# Patient Record
Sex: Female | Born: 1990 | Hispanic: Yes | Marital: Married | Smoking: Never smoker
Health system: Southern US, Community
[De-identification: ages and names within clinical notes are randomized; demographics above are authoritative.]

---

## 2018-10-29 ENCOUNTER — Emergency Department (HOSPITAL_COMMUNITY)
Admission: EM | Admit: 2018-10-29 | Discharge: 2018-10-29 | Disposition: A | Discharge status: Home / Self Care | Payer: BLUE CROSS/BLUE SHIELD | Attending: Emergency Medicine | Admitting: Emergency Medicine

## 2018-10-29 ENCOUNTER — Emergency Department (HOSPITAL_COMMUNITY): Payer: BLUE CROSS/BLUE SHIELD

## 2018-10-29 ENCOUNTER — Encounter (HOSPITAL_COMMUNITY): Payer: Self-pay | Admitting: Emergency Medicine

## 2018-10-29 DIAGNOSIS — N12 Tubulo-interstitial nephritis, not specified as acute or chronic: Secondary | ICD-10-CM | POA: Insufficient documentation

## 2018-10-29 DIAGNOSIS — R109 Unspecified abdominal pain: Secondary | ICD-10-CM | POA: Diagnosis present

## 2018-10-29 LAB — CBC
HCT: 42.3 % (ref 36.0–46.0)
Hemoglobin: 14.6 g/dL (ref 12.0–15.0)
MCH: 30.6 pg (ref 26.0–34.0)
MCHC: 34.5 g/dL (ref 30.0–36.0)
MCV: 88.7 fL (ref 80.0–100.0)
Platelets: 288 10*3/uL (ref 150–400)
RBC: 4.77 MIL/uL (ref 3.87–5.11)
RDW: 12 % (ref 11.5–15.5)
WBC: 6.4 10*3/uL (ref 4.0–10.5)
nRBC: 0 % (ref 0.0–0.2)

## 2018-10-29 LAB — I-STAT BETA HCG BLOOD, ED (MC, WL, AP ONLY): I-stat hCG, quantitative: <5 m[IU]/mL (ref <5)

## 2018-10-29 LAB — URINALYSIS, ROUTINE W REFLEX MICROSCOPIC
Bilirubin Urine: NEGATIVE
Glucose, UA: NEGATIVE mg/dL
Ketones, ur: NEGATIVE mg/dL
Nitrite: NEGATIVE
Protein, ur: 30 mg/dL — AB
Specific Gravity, Urine: 1.013 (ref 1.005–1.030)
Trans Epithel, UA: 1
WBC, UA: >50 WBC/hpf — ABNORMAL HIGH (ref 0–5)
pH: 6 (ref 5.0–8.0)

## 2018-10-29 LAB — BASIC METABOLIC PANEL
Anion gap: 12 (ref 5–15)
BUN: 12 mg/dL (ref 6–20)
CO2: 21 mmol/L — ABNORMAL LOW (ref 22–32)
Calcium: 9.4 mg/dL (ref 8.9–10.3)
Chloride: 103 mmol/L (ref 98–111)
Creatinine, Ser: 0.73 mg/dL (ref 0.44–1.00)
GFR calc Af Amer: >60 mL/min (ref >60)
GFR calc non Af Amer: >60 mL/min (ref >60)
Glucose, Bld: 128 mg/dL — ABNORMAL HIGH (ref 70–99)
Potassium: 3.9 mmol/L (ref 3.5–5.1)
Sodium: 136 mmol/L (ref 135–145)

## 2018-10-29 MED ORDER — SODIUM CHLORIDE 0.9 % IV SOLN
1.0000 g | Freq: Once | INTRAVENOUS | Status: AC
  Administered 2018-10-29: 1 g via INTRAVENOUS
  Filled 2018-10-29: qty 10

## 2018-10-29 MED ORDER — HYDROMORPHONE HCL 1 MG/ML IJ SOLN
1.0000 mg | Freq: Once | INTRAMUSCULAR | Status: AC
  Administered 2018-10-29: 1 mg via INTRAVENOUS
  Filled 2018-10-29: qty 1

## 2018-10-29 MED ORDER — ONDANSETRON 4 MG PO TBDP: ORAL_TABLET | ORAL | 0 refills | Status: AC

## 2018-10-29 MED ORDER — CEPHALEXIN 500 MG PO CAPS: 500.0000 mg | ORAL_CAPSULE | Freq: Four times a day (QID) | ORAL | 0 refills | Status: AC

## 2018-10-29 MED ORDER — ONDANSETRON HCL 4 MG/2ML IJ SOLN
4.0000 mg | Freq: Once | INTRAMUSCULAR | Status: AC
  Administered 2018-10-29: 4 mg via INTRAVENOUS
  Filled 2018-10-29: qty 2

## 2018-10-29 MED ORDER — SODIUM CHLORIDE 0.9 % IV BOLUS
1000.0000 mL | Freq: Once | INTRAVENOUS | Status: AC
  Administered 2018-10-29: 21:00:00 1000 mL via INTRAVENOUS

## 2018-10-29 MED ORDER — HYDROCODONE-ACETAMINOPHEN 5-325 MG PO TABS: 1.0000 | ORAL_TABLET | Freq: Four times a day (QID) | ORAL | 0 refills | Status: AC | PRN

## 2018-10-29 NOTE — ED Provider Notes (Signed)
[facility]   CSN: [id number] Arrival date & time: 10/29/18  1858    History   Chief Complaint Chief Complaint  Patient presents with  . Flank Pain    HPI Audrey Barton is a 28 y.o. female.     Audrey Barton is a 28 y.o. female who is otherwise healthy presents for evaluation of severe right flank pain which began today.  She reports that she has been having burning and pain with urination over the past few days but this morning noted having pain over her right flank this evening pain became much more severe, she now reports that she feels like she cannot sit still due to severe pain.  She has not noted any blood in her urine, denies prior history of kidney stones or kidney infections.  Denies any associated vaginal bleeding or discharge.  She has had some chills but no fevers.  No associated diarrhea, melena or hematochezia.  She has not taken anything to treat her symptoms prior to arrival, no other aggravating or alleviating factors.     History reviewed. No pertinent past medical history.  There are no active problems to display for this patient.   History reviewed. No pertinent surgical history.   OB History   No obstetric history on file.      Home Medications    Prior to Admission medications   Medication Sig Start Date End Date Taking? Authorizing Provider  cephALEXin (KEFLEX) 500 MG capsule Take 1 capsule (500 mg total) by mouth 4 (four) times daily for 10 days. 10/29/18 11/08/18  Dartha LodgeFord, Travante Knee N, PA-C  HYDROcodone-acetaminophen (NORCO) 5-325 MG tablet Take 1 tablet by mouth every 6 (six) hours as needed. 10/29/18   Dartha LodgeFord, Tiara Bartoli N, PA-C  ondansetron (ZOFRAN ODT) 4 MG disintegrating tablet 4mg  ODT q4 hours prn nausea/vomit 10/29/18   Dartha LodgeFord, Ilissa Rosner N, PA-C    Family History No family history on file.  Social History Social History   Tobacco Use  . Smoking status: Never Smoker   . Smokeless tobacco: Never Used  Substance Use Topics  . Alcohol use: Not Currently  . Drug use: Not Currently     Allergies   Patient has no known allergies.   Review of Systems Review of Systems  Constitutional: Negative for chills and fever.  HENT: Negative.   Eyes: Negative for visual disturbance.  Respiratory: Negative for cough and shortness of breath.   Cardiovascular: Negative for chest pain.  Gastrointestinal: Positive for abdominal pain and nausea. Negative for diarrhea and vomiting.  Genitourinary: Positive for dysuria, flank pain and frequency. Negative for hematuria, vaginal bleeding and vaginal discharge.  Musculoskeletal: Negative for arthralgias and myalgias.  Skin: Negative for color change and rash.  Neurological: Negative for syncope and light-headedness.     Physical Exam Updated Vital Signs BP (!) 145/104   Pulse (!) 107   Temp 98.6 F (37 C) (Oral)   Resp (!) 22   SpO2 100%   Physical Exam Vitals signs and nursing note reviewed.  Constitutional:      General: She is in acute distress.     Appearance: Normal appearance. She is well-developed. She is obese. She is not ill-appearing or diaphoretic.     Comments: Patient appears very uncomfortable, having difficulty sitting still due to pain.  HENT:     Head: Normocephalic and atraumatic.     Mouth/Throat:     Mouth: Mucous membranes are moist.  Pharynx: Oropharynx is clear.  Eyes:     General:        Right eye: No discharge.        Left eye: No discharge.     Pupils: Pupils are equal, round, and reactive to light.  Neck:     Musculoskeletal: Neck supple.  Cardiovascular:     Rate and Rhythm: Normal rate and regular rhythm.     Heart sounds: Normal heart sounds. No murmur. No friction rub. No gallop.   Pulmonary:     Effort: Pulmonary effort is normal. No respiratory distress.     Breath sounds: Normal breath sounds. No wheezing or rales.     Comments: Respirations equal and unlabored,  patient able to speak in full sentences, lungs clear to auscultation bilaterally Abdominal:     General: Bowel sounds are normal. There is no distension.     Palpations: Abdomen is soft. There is no mass.     Tenderness: There is no abdominal tenderness. There is right CVA tenderness. There is no guarding.     Comments: Abdomen is soft and nondistended, bowel sounds present throughout, all quadrants nontender to palpation with no guarding or rebound tenderness.  Patient with right-sided CVA tenderness, none on the left.  Musculoskeletal:        General: No deformity.  Skin:    General: Skin is warm and dry.     Capillary Refill: Capillary refill takes less than 2 seconds.  Neurological:     Mental Status: She is alert.     Coordination: Coordination normal.     Comments: Speech is clear, able to follow commands Moves extremities without ataxia, coordination intact  Psychiatric:        Mood and Affect: Mood normal.        Behavior: Behavior normal.      ED Treatments / Results  Labs (all labs ordered are listed, but only abnormal results are displayed) Labs Reviewed  URINALYSIS, ROUTINE W REFLEX MICROSCOPIC - Abnormal; Notable for the following components:      Result Value   APPearance CLOUDY (*)    Hgb urine dipstick MODERATE (*)    Protein, ur 30 (*)    Leukocytes,Ua LARGE (*)    WBC, UA >50 (*)    Bacteria, UA RARE (*)    All other components within normal limits  BASIC METABOLIC PANEL - Abnormal; Notable for the following components:   CO2 21 (*)    Glucose, Bld 128 (*)    All other components within normal limits  URINE CULTURE  CBC  I-STAT BETA HCG BLOOD, ED ([facility], [facility], [facility] ONLY)    EKG None  Radiology Ct Renal Stone Study  Result Date: 10/29/2018 CLINICAL DATA:  Left-sided flank pain EXAM: CT ABDOMEN AND PELVIS WITHOUT CONTRAST TECHNIQUE: Multidetector CT imaging of the abdomen and pelvis was performed following the standard protocol without IV contrast.  COMPARISON:  None. FINDINGS: Lower chest: No acute abnormality. Hepatobiliary: There is hepatomegaly with severe hepatic steatosis. The gallbladder is unremarkable. Pancreas: Unremarkable. No pancreatic ductal dilatation or surrounding inflammatory changes. Spleen: Normal in size without focal abnormality. Adrenals/Urinary Tract: There is a punctate nonobstructing stone in the lower pole the left kidney. No right-sided nephrolithiasis. There is no hydronephrosis. There are no radiopaque ureteral stones. The urinary bladder is unremarkable. Stomach/Bowel: The appendix is not reliably identified, however there are no significant inflammatory changes in the right lower quadrant. The stomach is moderately distended but otherwise unremarkable. There is no evidence  of a small-bowel obstruction. No CT evidence of colitis. Vascular/Lymphatic: No significant vascular findings are present. No enlarged abdominal or pelvic lymph nodes. Reproductive: An IUD is noted. There is no evidence of a significant ovarian mass. Other: No abdominal wall hernia or abnormality. No abdominopelvic ascites. Musculoskeletal: No acute or significant osseous findings. IMPRESSION: 1. No acute abnormality detected. 2. Punctate nonobstructing stone in the lower pole the left kidney. There is no hydronephrosis. There are no ureteral stones. 3. Severe hepatic steatosis. 4. No CT evidence of colitis. No CT evidence for acute cholecystitis. Electronically Signed   By: Katherine Mantlehristopher  Green M.D.   On: 10/29/2018 20:44    Procedures Procedures (including critical care time)  Medications Ordered in ED Medications  HYDROmorphone (DILAUDID) injection 1 mg (1 mg Intravenous Given 10/29/18 2059)  sodium chloride 0.9 % bolus 1,000 mL (0 mLs Intravenous Stopped 10/29/18 2141)  ondansetron (ZOFRAN) injection 4 mg (4 mg Intravenous Given 10/29/18 2054)  cefTRIAXone (ROCEPHIN) 1 g in sodium chloride 0.9 % 100 mL IVPB (0 g Intravenous Stopped 10/29/18 2215)      Initial Impression / Assessment and Plan / ED Course  I have reviewed the triage vital signs and the nursing notes.  Pertinent labs & imaging results that were available during my care of the patient were reviewed by me and considered in my medical decision making (see chart for details).  Patient presents with severe right flank pain which started today with associated dysuria.  On arrival patient is tachycardic but all other vitals are normal she appears extremely uncomfortable, unable to sit still.  Presentation concerning for possible kidney stone.  Could also have pyelonephritis.  Does not have lower abdominal or pelvic pain and I have low suspicion for ovarian torsion, she is not having vaginal bleeding or discharge.  Will get abdominal labs and CT renal stone study.  IV pain medication and nausea medication given as well as fluids.  Labs show no leukocytosis, normal hemoglobin, normal renal function and no acute electrolyte derangements that require intervention.  Negative pregnancy.  Urinalysis shows large leukocytes with 21-50 RBCs and greater than 50 WBCs with white cell clumps and some bacteria present this is certainly concerning for infection and with white cell clumps present likely pyelonephritis with associated flank pain.  CT renal stone study shows no obstructing stones, punctate renal calculi noted in the left renal pole patient has no pain on this side, no evidence of hydronephrosis.  Given urinalysis and reassuring CT feel pyelonephritis is likely cause of patient's symptoms.  She was given dose of IV Rocephin here in the ED is tolerating p.o. and has had significant improvement in her symptoms.  We will plan to discharge home on Keflex, urine culture pending.  Norco and Zofran given for symptom management at home.  Return precautions discussed.  Patient expresses understanding and agreement with plan.  All instructions gone over using Spanish interpreter and patient given plenty of  time to answer questions.  Discharged home in good condition.  Final Clinical Impressions(s) / ED Diagnoses   Final diagnoses:  Pyelonephritis    ED Discharge Orders         Ordered    cephALEXin (KEFLEX) 500 MG capsule  4 times daily     10/29/18 2250    ondansetron (ZOFRAN ODT) 4 MG disintegrating tablet     10/29/18 2250    HYDROcodone-acetaminophen (NORCO) 5-325 MG tablet  Every 6 hours PRN     10/29/18 2250  Jacqlyn Larsen, PA-C 10/29/18 2359    Little, Wenda Overland, MD 10/31/18 [phone]

## 2018-10-29 NOTE — ED Triage Notes (Signed)
Pt in POV reporting L flank pain onset PTA. Pt appears to be in discomfort in triage.

## 2018-10-29 NOTE — Discharge Instructions (Signed)
You have a kidney infection, please take antibiotics 4 times daily, make sure you complete entire course of antibiotics even if your symptoms have completely resolved.  For pain take ibuprofen 600 mg every 6 hours, Norco as needed for breakthrough pain, do not drive while using this medication as it can cause drowsiness.  Make sure you are drinking plenty of fluids.  Return to the emergency department if you have significantly worsening pain, fevers, persistent vomiting or you are unable to keep down your antibiotics or any other new or concerning symptoms.

## 2018-10-31 LAB — URINE CULTURE: Culture: <10000 — AB

## 2019-02-17 ENCOUNTER — Other Ambulatory Visit: Payer: Self-pay

## 2019-02-17 DIAGNOSIS — Z20822 Contact with and (suspected) exposure to covid-19: Secondary | ICD-10-CM

## 2019-02-18 LAB — NOVEL CORONAVIRUS, NAA: SARS-CoV-2, NAA: NOT DETECTED

## 2019-02-21 ENCOUNTER — Telehealth: Payer: Self-pay | Admitting: General Practice

## 2019-02-21 NOTE — Telephone Encounter (Signed)
Patient called in with help of friend and received negative test results.

## 2019-02-21 NOTE — Telephone Encounter (Signed)
Negative COVID results given. Patient results "NOT Detected." Caller expressed understanding. °Faxed results over to [employer] per request of Garcon Ramirez  °Fax: [phone] ° °

## 2020-07-05 IMAGING — CT CT RENAL STONE PROTOCOL
2 of 4 series · 16 of 46 positions shown, 18 images · non-contrast
Comparison: None.

CLINICAL DATA: Left-sided flank pain

EXAM:
CT ABDOMEN AND PELVIS WITHOUT CONTRAST
TECHNIQUE: Multidetector CT imaging of the abdomen and pelvis was performed
following the standard protocol without IV contrast.

[Series 3: renal stone 5.0 · axial · 0.94mm/px · z∈[+606,+1086]mm · 13 of 105 slices shown, 15 images]
[im 5/105  soft-tissue]
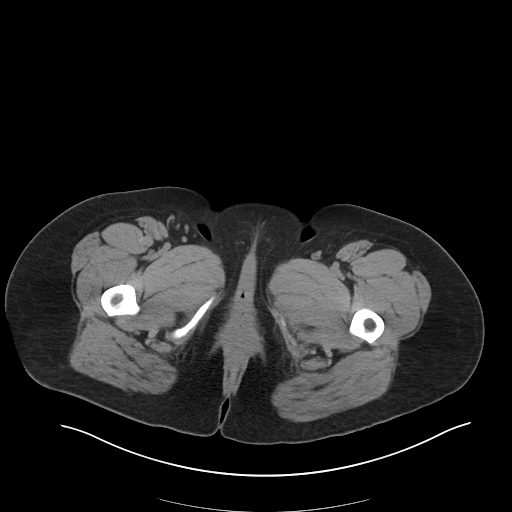
[im 5/105  bone]
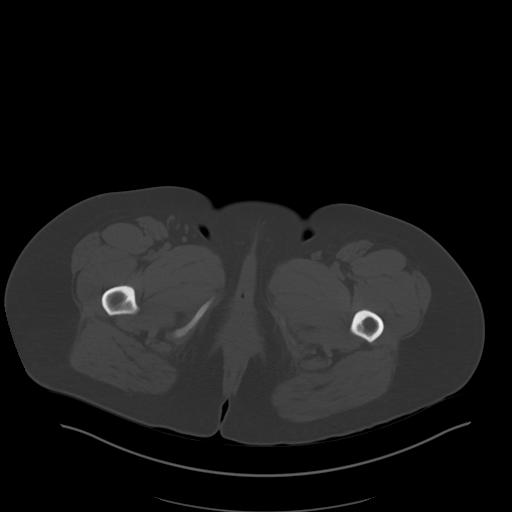
[im 13/105  soft-tissue]
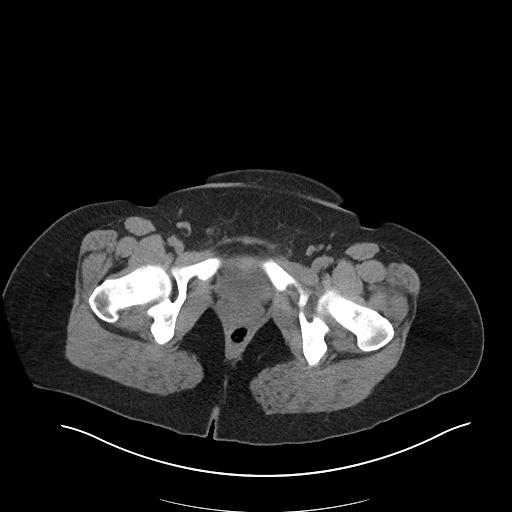
[im 21/105  soft-tissue]
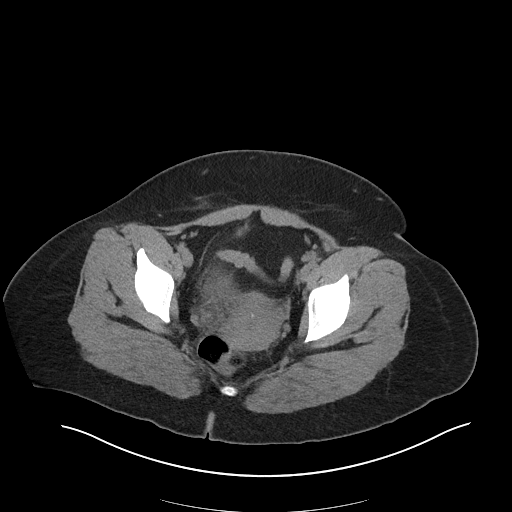
[im 29/105  soft-tissue]
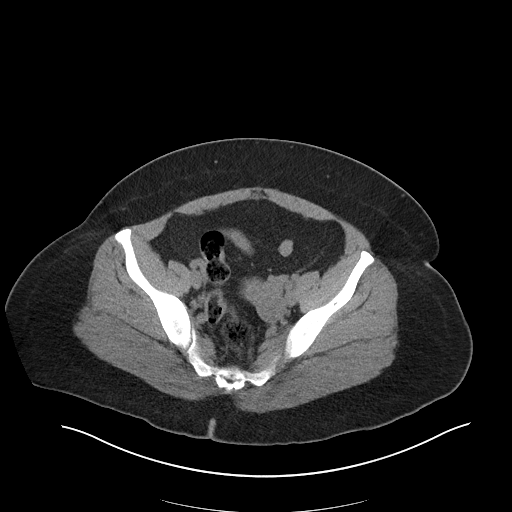
[im 37/105  soft-tissue]
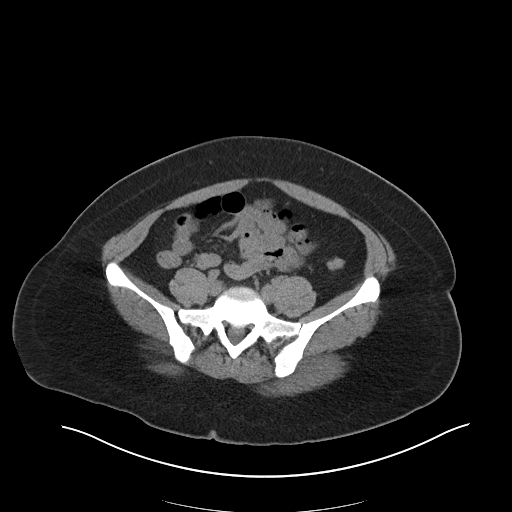
[im 45/105  soft-tissue]
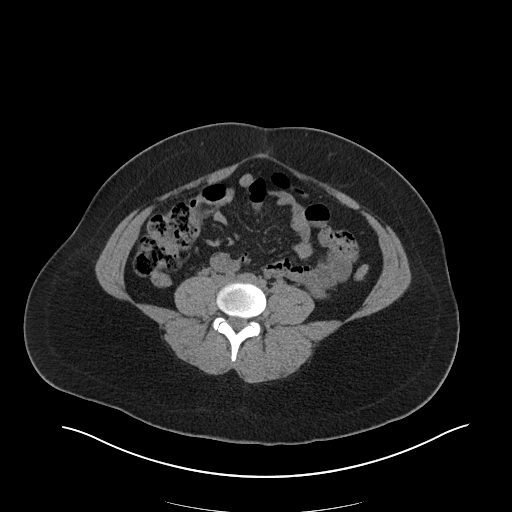
[im 53/105  soft-tissue]
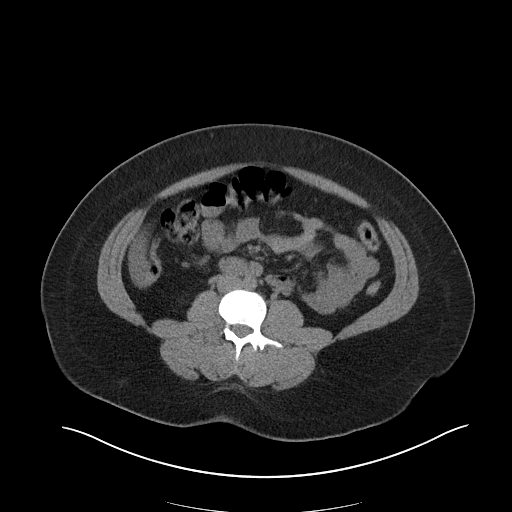
[im 61/105  soft-tissue]
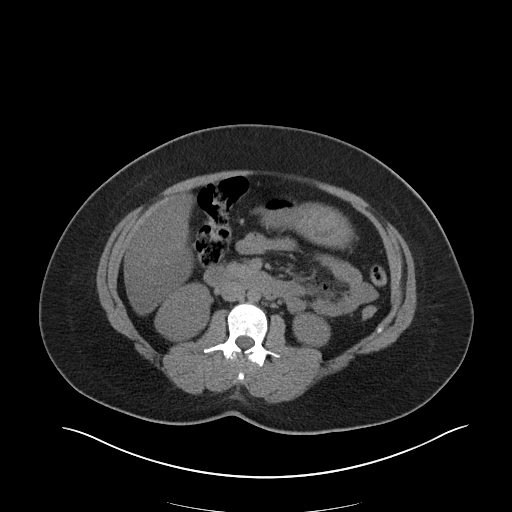
[im 69/105  soft-tissue]
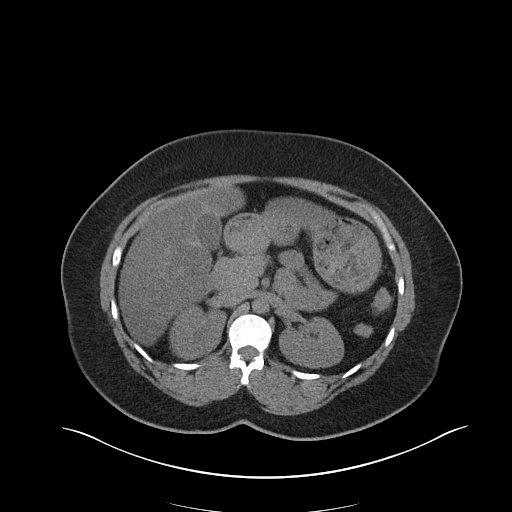
[im 69/105  bone]
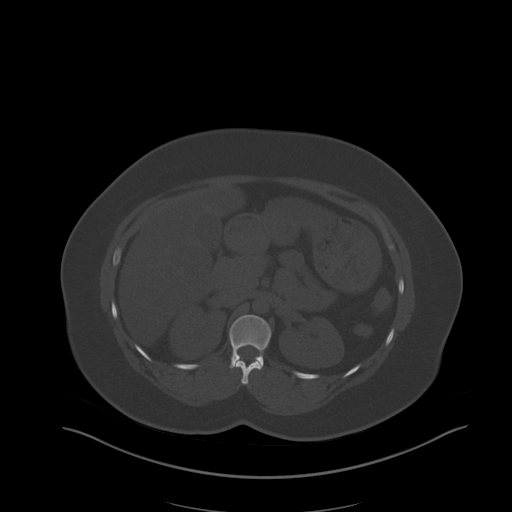
[im 77/105  soft-tissue]
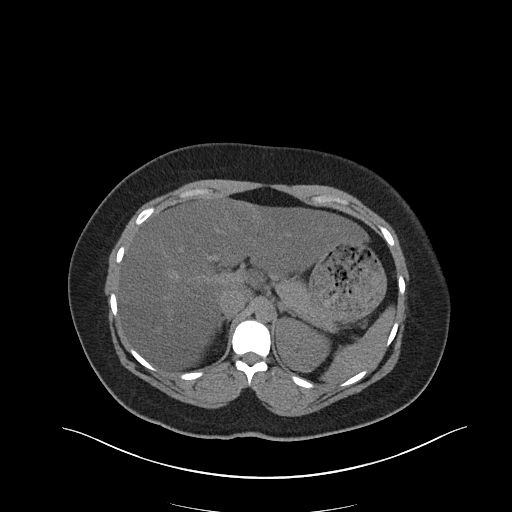
[im 85/105  soft-tissue]
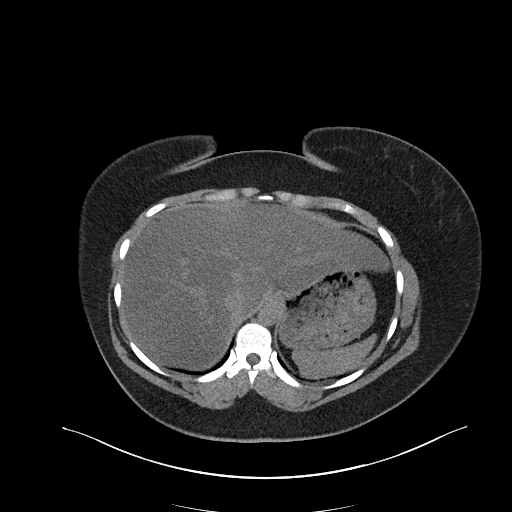
[im 93/105  soft-tissue]
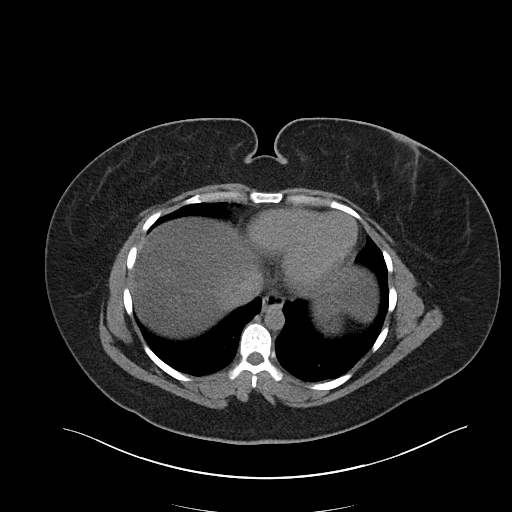
[im 101/105  soft-tissue]
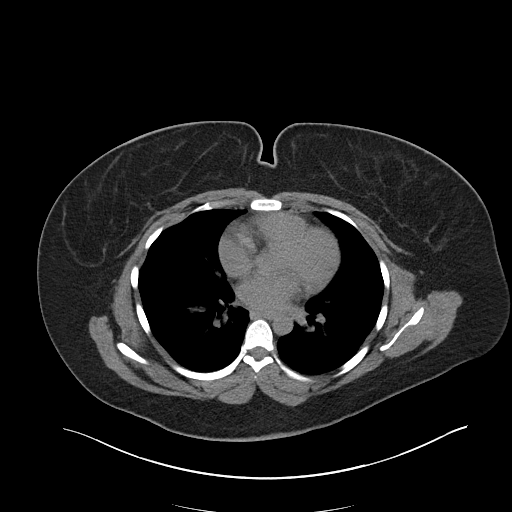

[Series 6: renal stone 3.0 cor · coronal · 0.89mm/px · 3 of 110 slices shown]
[im 37/110  soft-tissue]
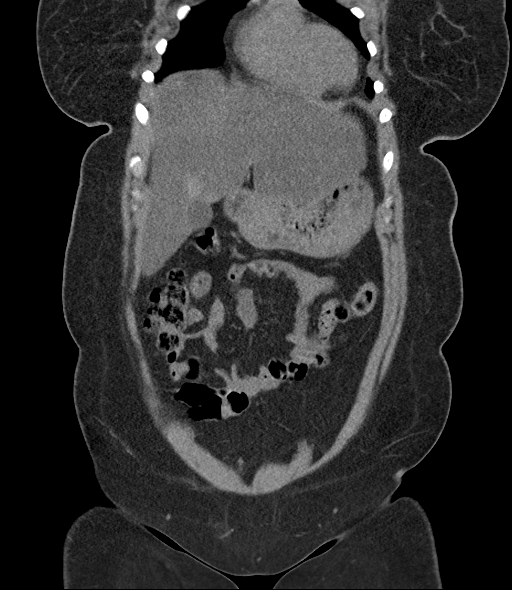
[im 49/110  soft-tissue]
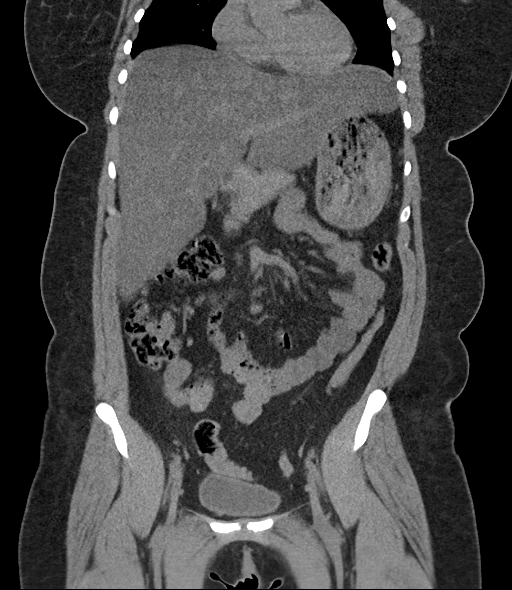
[im 61/110  soft-tissue]
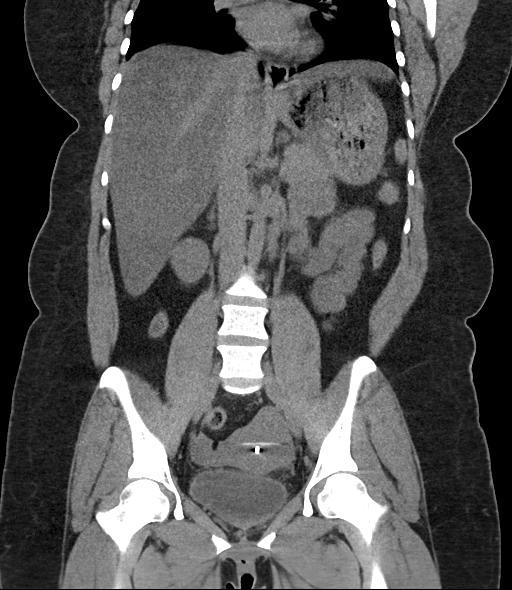

[16 of 46 positions shown; findings below may reference images not displayed]

FINDINGS: Lower chest: No acute abnormality.

Hepatobiliary: There is hepatomegaly with severe hepatic steatosis.
The gallbladder is unremarkable.

Pancreas: Unremarkable. No pancreatic ductal dilatation or
surrounding inflammatory changes.

Spleen: Normal in size without focal abnormality.

Adrenals/Urinary Tract: There is a punctate nonobstructing stone in
the lower pole the left kidney. No right-sided nephrolithiasis.
There is no hydronephrosis. There are no radiopaque ureteral stones.
The urinary bladder is unremarkable.

Stomach/Bowel: The appendix is not reliably identified, however
there are no significant inflammatory changes in the right lower
quadrant. The stomach is moderately distended but otherwise
unremarkable. There is no evidence of a small-bowel obstruction. No
CT evidence of colitis.

Vascular/Lymphatic: No significant vascular findings are present. No
enlarged abdominal or pelvic lymph nodes.

Reproductive: An IUD is noted. There is no evidence of a significant
ovarian mass.

Other: No abdominal wall hernia or abnormality. No abdominopelvic
ascites.

Musculoskeletal: No acute or significant osseous findings.
IMPRESSION: 1. No acute abnormality detected.
2. Punctate nonobstructing stone in the lower pole the left kidney.
There is no hydronephrosis. There are no ureteral stones.
3. Severe hepatic steatosis.
4. No CT evidence of colitis. No CT evidence for acute
cholecystitis.

## 2021-07-03 ENCOUNTER — Other Ambulatory Visit: Payer: Self-pay | Admitting: Family Medicine

## 2021-07-03 DIAGNOSIS — Z3201 Encounter for pregnancy test, result positive: Secondary | ICD-10-CM

## 2021-07-09 ENCOUNTER — Ambulatory Visit: Admission: RE | Admit: 2021-07-09 | Payer: BLUE CROSS/BLUE SHIELD | Source: Ambulatory Visit

## 2021-07-16 ENCOUNTER — Other Ambulatory Visit: Payer: Self-pay | Admitting: Family Medicine

## 2021-07-16 ENCOUNTER — Other Ambulatory Visit: Payer: Self-pay

## 2021-07-16 ENCOUNTER — Ambulatory Visit
Admission: RE | Admit: 2021-07-16 | Discharge: 2021-07-16 | Disposition: A | Discharge status: Home / Self Care | Payer: BLUE CROSS/BLUE SHIELD | Source: Ambulatory Visit | Attending: Family Medicine | Admitting: Family Medicine

## 2021-07-16 DIAGNOSIS — Z3201 Encounter for pregnancy test, result positive: Secondary | ICD-10-CM | POA: Insufficient documentation

## 2022-04-25 HISTORY — PX: BILATERAL SALPINGECTOMY: SHX5743

## 2022-04-25 NOTE — Discharge Summary (Addendum)
 Gynecology Physician Discharge Summary   Admit Date: 04/25/2022  Discharge Date and Time: 04/27/2022 9:55 AM   Discharge to: Home  Discharge Service: Gynecology (GYN)  Discharge Attending Physician: Coye Kerns, MD  Discharge Diagnoses:  Principal Problem:   Injury of large intestine   Procedures: Laparoscopic bilateral salpingectomy complicated by colonic perforation with primary repair  Hospital Course:  Audrey Barton is a 31 y.o. [id number] who underwent the surgery above. She was admitted for postoperative observation in the setting of her colonic perforation complication. She had a routine postoperative course. On the day of discharge, she was meeting all postoperative goals; ambulating, voiding spontaneously, and tolerating a regular diet.  Patient was seen by surgery today and cleared for discharge. She was discharged to home on POD#2 and will follow up in 4 weeks with Dr. Kerns.   Condition at Discharge: stable  Discharge Day Services: The patient was seen by the primary team, and deemed ready for discharge. BP 121/74   Pulse 69   Temp 36.2 C (97.2 F) (Oral)   Resp 16   Ht 152.4 cm (5')   Wt 83.8 kg (184 lb 11.2 oz)   LMP 05/19/2021   SpO2 97%   Breastfeeding Yes   BMI 36.07 kg/m   Gen: no apparent distress CV: regular rate and rhythm Pulm: normal work of breathing Abd: Soft, non-distended, minimally tender to palpation. Laparoscopic incisions well approximated with overlying surgical glue, no surrounding erythema or drainage.   Discharge Medications:    Your Medication List     STOP taking these medications    STIMULANT LAXATIVE PLUS 8.6-50 mg Generic drug: senna-docusate       START taking these medications    oxyCODONE 5 MG immediate release tablet Commonly known as: ROXICODONE Use segn sea necesario para el dolor. Tome 1 pldora por va oral cada 4 horas. (Dosis = 5 mg) Use durante un mximo de 5 das. (Take 1 tablet (5 mg  total) by mouth every four (4) hours as needed for pain for up to 5 days.)   polyethylene glycol 17 gram/dose powder Commonly known as: GLYCOLAX Mezcle el contenido de 1 tapa (17 gramos) en 4 a 8 onzas de [place], [place] o t y beba a diario. (Mix 1 capful (17 grams) in 4-8 ounces of water,juice or tea and drink daily)       CONTINUE taking these medications    acetaminophen  325 MG tablet Commonly known as: TylenoL  Use segn sea necesario para el dolor. Tome 2 pldoras por va oral cada 4 horas. (Dosis = 650 mg) (Take 2 tablets (650 mg total) by mouth every four (4) hours as needed for pain.)   ibuprofen 600 MG tablet Commonly known as: MOTRIN Use segn sea necesario para el dolor. Tome 1 pldora por va oral cada 6 horas. (Dosis = 600 mg) (Take 1 tablet (600 mg total) by mouth every six (6) hours as needed for pain.)       ASK your doctor about these medications    PRENATAL VITAMIN PLUS LOW IRON 27-1 mg Tab Generic drug: multivitamin, prenatal (folic acid-iron)        Recent Test Results:  Lab Results  Component Value Date   WBC 6.4 04/27/2022   HGB 10.8 (L) 04/27/2022   HCT 32.9 (L) 04/27/2022   PLT 231 04/27/2022    Lab Results  Component Value Date   NA 139 04/26/2022   K 3.7 04/26/2022   CL 107 04/26/2022  CO2 24.6 04/26/2022   BUN 8 (L) 04/26/2022   CREATININE 0.69 04/26/2022   GLU 109 04/26/2022   CALCIUM 9.0 04/26/2022    Lab Results  Component Value Date   BILITOT 0.4 02/20/2022   PROT 6.8 02/20/2022   ALBUMIN 3.2 (L) 02/20/2022   ALT 28 02/20/2022   AST 36 (H) 02/20/2022   ALKPHOS 295 (H) 02/20/2022    Pending Test Results:  Pending Labs     Order Current Status   Surgical pathology exam In process       Discharge Instructions:   Diet Instructions     Discharge diet (specify)     Discharge Nutrition Therapy: Regular      Follow Up instructions and Outpatient Referrals    Discharge instructions      Appointments which have  been scheduled for you    May 22, 2022  8:45 AM (Arrive by 8:30 AM) POST OP with Coye Grace Kerns, MD [facility] GENERAL OB GYN [facility] [place] [facility]) [address] [place] [address] [phone]        Body mass index is 36.07 kg/m.       Code Status: Full Code

## 2023-12-24 ENCOUNTER — Other Ambulatory Visit: Payer: Self-pay

## 2023-12-24 ENCOUNTER — Emergency Department
Admission: EM | Admit: 2023-12-24 | Discharge: 2023-12-25 | Disposition: A | Discharge status: Home / Self Care | Payer: Self-pay | Attending: Emergency Medicine | Admitting: Emergency Medicine

## 2023-12-24 DIAGNOSIS — I10 Essential (primary) hypertension: Secondary | ICD-10-CM | POA: Insufficient documentation

## 2023-12-24 DIAGNOSIS — R519 Headache, unspecified: Secondary | ICD-10-CM | POA: Diagnosis not present

## 2023-12-24 DIAGNOSIS — R112 Nausea with vomiting, unspecified: Secondary | ICD-10-CM | POA: Diagnosis not present

## 2023-12-24 DIAGNOSIS — M542 Cervicalgia: Secondary | ICD-10-CM | POA: Diagnosis present

## 2023-12-24 NOTE — ED Triage Notes (Signed)
 Pt reports 3 days ago she saw PCP for neck pain and told it was related to stress, pt reports pain has gotten worse since and pt has developed headache. Pt denies recent illness or injury/trauma to neck

## 2023-12-25 ENCOUNTER — Emergency Department

## 2023-12-25 ENCOUNTER — Encounter: Payer: Self-pay | Admitting: Emergency Medicine

## 2023-12-25 LAB — BASIC METABOLIC PANEL WITH GFR
Anion gap: 13 (ref 5–15)
BUN: 14 mg/dL (ref 6–20)
CO2: 22 mmol/L (ref 22–32)
Calcium: 9.4 mg/dL (ref 8.9–10.3)
Chloride: 103 mmol/L (ref 98–111)
Creatinine, Ser: 0.58 mg/dL (ref 0.44–1.00)
GFR, Estimated: >60 mL/min
Glucose, Bld: 93 mg/dL (ref 70–99)
Potassium: 3.7 mmol/L (ref 3.5–5.1)
Sodium: 138 mmol/L (ref 135–145)

## 2023-12-25 LAB — CBC WITH DIFFERENTIAL/PLATELET
Abs Immature Granulocytes: 0.01 K/uL (ref 0.00–0.07)
Basophils Absolute: 0.1 K/uL (ref 0.0–0.1)
Basophils Relative: 1 %
Eosinophils Absolute: 0.2 K/uL (ref 0.0–0.5)
Eosinophils Relative: 4 %
HCT: 42.8 % (ref 36.0–46.0)
Hemoglobin: 14.4 g/dL (ref 12.0–15.0)
Immature Granulocytes: 0 %
Lymphocytes Relative: 54 %
Lymphs Abs: 2.8 K/uL (ref 0.7–4.0)
MCH: 30.4 pg (ref 26.0–34.0)
MCHC: 33.6 g/dL (ref 30.0–36.0)
MCV: 90.3 fL (ref 80.0–100.0)
Monocytes Absolute: 0.3 K/uL (ref 0.1–1.0)
Monocytes Relative: 6 %
Neutro Abs: 1.8 K/uL (ref 1.7–7.7)
Neutrophils Relative %: 35 %
Platelets: 279 K/uL (ref 150–400)
RBC: 4.74 MIL/uL (ref 3.87–5.11)
RDW: 12.3 % (ref 11.5–15.5)
WBC: 5.2 K/uL (ref 4.0–10.5)
nRBC: 0 % (ref 0.0–0.2)

## 2023-12-25 MED ORDER — DIPHENHYDRAMINE HCL 50 MG/ML IJ SOLN
12.5000 mg | INTRAMUSCULAR | Status: AC
  Administered 2023-12-25: 12.5 mg via INTRAVENOUS
  Filled 2023-12-25: qty 1

## 2023-12-25 MED ORDER — SODIUM CHLORIDE 0.9 % IV BOLUS
500.0000 mL | Freq: Once | INTRAVENOUS | Status: AC
  Administered 2023-12-25: 500 mL via INTRAVENOUS

## 2023-12-25 MED ORDER — IOHEXOL 350 MG/ML SOLN
75.0000 mL | Freq: Once | INTRAVENOUS | Status: AC | PRN
  Administered 2023-12-25: 75 mL via INTRAVENOUS

## 2023-12-25 MED ORDER — KETOROLAC TROMETHAMINE 30 MG/ML IJ SOLN
15.0000 mg | Freq: Once | INTRAMUSCULAR | Status: AC
  Administered 2023-12-25: 15 mg via INTRAVENOUS
  Filled 2023-12-25: qty 1

## 2023-12-25 MED ORDER — DROPERIDOL 2.5 MG/ML IJ SOLN
2.5000 mg | Freq: Once | INTRAMUSCULAR | Status: AC
  Administered 2023-12-25: 2.5 mg via INTRAVENOUS
  Filled 2023-12-25: qty 2

## 2023-12-25 NOTE — ED Provider Notes (Signed)
 [facility] Provider Note    Event Date/Time   First MD Initiated Contact with Patient 12/24/23 2312     (approximate)   History   Neck Pain  The patient and/or family speak(s) Spanish.  They understand they have the right to the use of a hospital interpreter, however at this time they prefer to speak directly with me in Spanish.  They know that they can ask for an interpreter at any time.   HPI Audrey Barton is a 33 y.o. female who presents for evaluation of about 3 days of pain in the right side of her neck that radiates up into her head.  This is accompanied with nausea and vomiting.  She said that she does not feel dizzy and does not have any weakness in her arms or her legs.  However the pain is severe and makes her feel terrible all over including the nausea.  No changes in her vision.  No trouble with word finding or speech in general.  No traumatic injury to the right side of her head or neck of which she is aware.  She saw her regular doctor about the pain who told her it is likely due to increased stress.  The pain is reproducible with palpation on the right side of her neck.     Physical Exam   Triage Vital Signs: ED Triage Vitals  Encounter Vitals Group     BP 12/24/23 2049 (!) 150/100     Girls Systolic BP Percentile --      Girls Diastolic BP Percentile --      Boys Systolic BP Percentile --      Boys Diastolic BP Percentile --      Pulse Rate 12/24/23 2049 84     Resp 12/24/23 2049 18     Temp 12/24/23 2049 98.2 F (36.8 C)     Temp src --      SpO2 12/24/23 2049 99 %     Weight 12/24/23 2047 98 kg (216 lb)     Height 12/24/23 2047 1.626 m (5' 4)     Head Circumference --      Peak Flow --      Pain Score 12/24/23 2047 8     Pain Loc --      Pain Education --      Exclude from Growth Chart --     Most recent vital signs: Vitals:   12/25/23 0045 12/25/23 0103  BP: 127/77   Pulse: 66   Resp: 10   Temp:  98 F  (36.7 C)  SpO2: 100%     General: Awake, alert, appears very uncomfortable. Head:  Pupils seem constricted bilaterally, equal, appropriately interactive to light on exam.  Extraocular movement is intact.  Patient is not exhibiting any signs of photophobia. CV:  Good peripheral perfusion.  Regular rate and rhythm. Resp:  Normal effort. Speaking easily and comfortably, no accessory muscle usage nor intercostal retractions.   Abd:  No distention.  Other:  Patient has reproducible tenderness to palpation of the muscles of the right side of her neck.  No audible bruit.  No visible physical abnormalities such as an expanding neck hematoma.  Patient has no difficulty speaking or swallowing or tolerating her secretions.  She has equal and normal grip strength bilaterally, no appreciable neurological deficits, NIH stroke scale of 0.   ED Results / Procedures / Treatments   Labs (all labs ordered are listed, but  only abnormal results are displayed) Labs Reviewed  BASIC METABOLIC PANEL WITH GFR  CBC WITH DIFFERENTIAL/PLATELET      RADIOLOGY See ED course for details   PROCEDURES:  Critical Care performed: No  .1-3 Lead EKG Interpretation  Performed by: Gordan Huxley, MD Authorized by: Gordan Huxley, MD     Interpretation: normal     ECG rate:  85   ECG rate assessment: normal     Rhythm: sinus rhythm     Ectopy: none     Conduction: normal       IMPRESSION / MDM / ASSESSMENT AND PLAN / ED COURSE  I reviewed the triage vital signs and the nursing notes.                              Differential diagnosis includes, but is not limited to, migraine, nonspecific headache, carotid dissection, vertebral artery dissection, cerebral aneurysm, intracranial hemorrhage.  Patient's presentation is most consistent with acute presentation with potential threat to life or bodily function.  Labs/studies ordered: CBC with differential, CMP, CT angio head/neck  Interventions/Medications  given:  Medications  droperidol  (INAPSINE ) 2.5 MG/ML injection 2.5 mg (2.5 mg Intravenous Given 12/25/23 0056)  diphenhydrAMINE  (BENADRYL ) injection 12.5 mg (12.5 mg Intravenous Given 12/25/23 0055)  ketorolac  (TORADOL ) 30 MG/ML injection 15 mg (15 mg Intravenous Given 12/25/23 0051)  sodium chloride  0.9 % bolus 500 mL (500 mLs Intravenous New Bag/Given 12/25/23 0058)  iohexol  (OMNIPAQUE ) 350 MG/ML injection 75 mL (75 mLs Intravenous Contrast Given 12/25/23 0135)    (Note:  hospital course my include additional interventions and/or labs/studies not listed above.)   Vital signs are reassuring other than some hypertension which may be situational.  Neuroexam is normal.  However, the patient does not have a history of headaches and given the severity of the pain in the right side of her neck and radiating up into her head, I will treat empirically for migraine but also obtain CTA head/neck to rule out a vascular emergency and intracranial bleed.  Patient has had bilateral salpingectomy so no need for urine pregnancy test.  Treating empirically for migraine with medications as listed above.  Patient understands and agrees with the plan.  Labs pending at this time  The patient is on the cardiac monitor to evaluate for evidence of arrhythmia and/or significant heart rate changes.   Clinical Course as of 12/25/23 0245  Fri Dec 25, 2023  0243 CT ANGIO HEAD NECK W WO CM I independently viewed and interpreted the patient's head and neck CTA.  I see no evidence of acute intracranial hemorrhage, aneurysm, nor occlusive process.  I also read the radiologist's report, which confirmed no acute findings.. [CF]  9756 I reassessed the patient and she said that her headache and neck pain have completely resolved.  She feels much better and was able to get some rest and says she feels ready to go home.  I updated her regarding the reassuring results and she is comfortable with the plan for discharge and outpatient  follow-up.  The patient's medical screening exam is reassuring with no indication of an emergent medical condition requiring hospitalization or additional evaluation at this point.  The patient is safe and appropriate for discharge and outpatient follow up. [CF]    Clinical Course User Index [CF] Gordan Huxley, MD     FINAL CLINICAL IMPRESSION(S) / ED DIAGNOSES   Final diagnoses:  Neck pain  Acute nonintractable headache, unspecified  headache type     Rx / DC Orders   ED Discharge Orders     None        Note:  This document was prepared using Dragon voice recognition software and may include unintentional dictation errors.   Gordan Huxley, MD 12/25/23 [phone]

## 2024-05-16 ENCOUNTER — Emergency Department
Admission: EM | Admit: 2024-05-16 | Discharge: 2024-05-16 | Disposition: A | Discharge status: Home / Self Care | Attending: Emergency Medicine | Admitting: Emergency Medicine

## 2024-05-16 ENCOUNTER — Emergency Department

## 2024-05-16 DIAGNOSIS — H8110 Benign paroxysmal vertigo, unspecified ear: Secondary | ICD-10-CM | POA: Diagnosis not present

## 2024-05-16 DIAGNOSIS — R42 Dizziness and giddiness: Secondary | ICD-10-CM

## 2024-05-16 LAB — BASIC METABOLIC PANEL WITH GFR
Anion gap: 12 (ref 5–15)
BUN: 10 mg/dL (ref 6–20)
CO2: 26 mmol/L (ref 22–32)
Calcium: 9.3 mg/dL (ref 8.9–10.3)
Chloride: 101 mmol/L (ref 98–111)
Creatinine, Ser: 0.6 mg/dL (ref 0.44–1.00)
GFR, Estimated: >60 mL/min
Glucose, Bld: 121 mg/dL — ABNORMAL HIGH (ref 70–99)
Potassium: 3.8 mmol/L (ref 3.5–5.1)
Sodium: 138 mmol/L (ref 135–145)

## 2024-05-16 LAB — CBC
HCT: 40.2 % (ref 36.0–46.0)
Hemoglobin: 13.5 g/dL (ref 12.0–15.0)
MCH: 29.7 pg (ref 26.0–34.0)
MCHC: 33.6 g/dL (ref 30.0–36.0)
MCV: 88.5 fL (ref 80.0–100.0)
Platelets: 283 K/uL (ref 150–400)
RBC: 4.54 MIL/uL (ref 3.87–5.11)
RDW: 11.9 % (ref 11.5–15.5)
WBC: 4.1 K/uL (ref 4.0–10.5)
nRBC: 0 % (ref 0.0–0.2)

## 2024-05-16 LAB — TROPONIN T, HIGH SENSITIVITY: Troponin T High Sensitivity: <15 ng/L (ref 0–19)

## 2024-05-16 MED ORDER — MECLIZINE HCL 25 MG PO TABS
25.0000 mg | ORAL_TABLET | Freq: Once | ORAL | Status: AC
  Administered 2024-05-16: 25 mg via ORAL
  Filled 2024-05-16: qty 1

## 2024-05-16 MED ORDER — MECLIZINE HCL 25 MG PO TABS: 25.0000 mg | ORAL_TABLET | Freq: Three times a day (TID) | ORAL | 0 refills | Status: AC | PRN

## 2024-05-16 NOTE — ED Triage Notes (Signed)
 Pt presents to the ED via POV from home. Pt reports feeling confused and dizzy after starting spironolactone. Pt reports starting spironolactone four days ago for hair loss. PT reports 8/10 chest pain. Needs interpreter.

## 2024-05-16 NOTE — ED Provider Notes (Signed)
 "  [facility] Provider Note    Event Date/Time   First MD Initiated Contact with Patient 05/16/24 1918     (approximate)   History   Weakness   HPI  Audrey Barton is a 33 y.o. female who presents to the ED for evaluation of Weakness   Patient presents with husband for evaluation of intermittent vertiginous dizziness, sensation of weakness over the past week.  She reports primarily 4-5 episodes of vertiginous dizziness while leaning forward, causing her to fall on a couple occasions.   Reports developing substernal chest discomfort in the past 1-2 days without shortness of breath, syncope.   He reports significant anxiety  No OCPs or exogenous estrogen, no history of VTE   Physical Exam   Triage Vital Signs: ED Triage Vitals  Encounter Vitals Group     BP 05/16/24 1611 (!) 128/93     Girls Systolic BP Percentile --      Girls Diastolic BP Percentile --      Boys Systolic BP Percentile --      Boys Diastolic BP Percentile --      Pulse Rate 05/16/24 1611 77     Resp 05/16/24 1611 18     Temp 05/16/24 1611 98.2 F (36.8 C)     Temp Source 05/16/24 1611 Oral     SpO2 05/16/24 1611 95 %     Weight 05/16/24 1614 216 lb 0.8 oz (98 kg)     Height 05/16/24 1614 5' 4 (1.626 m)     Head Circumference --      Peak Flow --      Pain Score 05/16/24 1613 8     Pain Loc --      Pain Education --      Exclude from Growth Chart --     Most recent vital signs: Vitals:   05/16/24 1611 05/16/24 2040  BP: (!) 128/93 120/78  Pulse: 77 80  Resp: 18 17  Temp: 98.2 F (36.8 C)   SpO2: 95% 97%    General: Awake, no distress.  CV:  Good peripheral perfusion.  Resp:  Normal effort.  Abd:  No distention.  MSK:  No deformity noted.  Neuro:  No focal deficits appreciated. Other:     ED Results / Procedures / Treatments   Labs (all labs ordered are listed, but only abnormal results are displayed) Labs Reviewed  BASIC METABOLIC  PANEL WITH GFR - Abnormal; Notable for the following components:      Result Value   Glucose, Bld 121 (*)    All other components within normal limits  CBC  POC URINE PREG, ED  TROPONIN T, HIGH SENSITIVITY  TROPONIN T, HIGH SENSITIVITY    EKG Sinus rhythm with a rate of 66 bpm.  Normal axis and intervals.  No present acute ischemia.  RADIOLOGY CXR interpreted by me without evidence of acute cardiopulmonary pathology.  Official radiology report(s): DG Chest 2 View Result Date: 05/16/2024 CLINICAL DATA:  Chest pain.  Confusion and dizziness. EXAM: CHEST - 2 VIEW COMPARISON:  None Available. FINDINGS: The cardiomediastinal contours are normal. The lungs are clear. Pulmonary vasculature is normal. No consolidation, pleural effusion, or pneumothorax. No acute osseous abnormalities are seen. IMPRESSION: No active cardiopulmonary disease. Electronically Signed   By: Andrea Gasman M.D.   On: 05/16/2024 18:41    PROCEDURES and INTERVENTIONS:  Procedures  Medications  meclizine  (ANTIVERT ) tablet 25 mg (25 mg Oral Given 05/16/24 2009)  IMPRESSION / MDM / ASSESSMENT AND PLAN / ED COURSE  I reviewed the triage vital signs and the nursing notes.  Differential diagnosis includes, but is not limited to, BPPV, symptomatic anemia, PE, ACS  {Patient presents with symptoms of an acute illness or injury that is potentially life-threatening.  Patient presents with intermittent vertiginous dizziness, likely BPPV.    Low risk chest discomfort and she is PERC negative, doubt PE, ACS.  Negative troponin, metabolic panel and CBC.  Clear CXR.  Provide meclizine  with improving symptoms, discharged with the same, referral to ENT, discussed close ED return precautions.    Clinical Course as of 05/16/24 2207  Mon May 16, 2024  8042 A week of intermittent dizziness. Vertigo with leaning forward [DS]    Clinical Course User Index [DS] Claudene Rover, MD     FINAL CLINICAL IMPRESSION(S) / ED  DIAGNOSES   Final diagnoses:  Vertigo  Benign paroxysmal positional vertigo, unspecified laterality     Rx / DC Orders   ED Discharge Orders          Ordered    meclizine  (ANTIVERT ) 25 MG tablet  3 times daily PRN        05/16/24 2003             Note:  This document was prepared using Dragon voice recognition software and may include unintentional dictation errors.   Claudene Rover, MD 05/16/24 2207  "
